# Patient Record
Sex: Female | Born: 1980 | Race: Black or African American | Hispanic: No | Marital: Single | Smoking: Current some day smoker
Health system: Southern US, Community
[De-identification: ages and names within clinical notes are randomized; demographics above are authoritative.]

---

## 2015-04-12 ENCOUNTER — Emergency Department (HOSPITAL_COMMUNITY): Payer: Self-pay

## 2015-04-12 ENCOUNTER — Emergency Department (HOSPITAL_COMMUNITY)
Admission: EM | Admit: 2015-04-12 | Discharge: 2015-04-12 | Disposition: A | Discharge status: Home / Self Care | Payer: Self-pay | Attending: Emergency Medicine | Admitting: Emergency Medicine

## 2015-04-12 ENCOUNTER — Encounter (HOSPITAL_COMMUNITY): Payer: Self-pay | Admitting: Emergency Medicine

## 2015-04-12 DIAGNOSIS — S6992XA Unspecified injury of left wrist, hand and finger(s), initial encounter: Secondary | ICD-10-CM | POA: Insufficient documentation

## 2015-04-12 DIAGNOSIS — W2105XA Struck by basketball, initial encounter: Secondary | ICD-10-CM | POA: Insufficient documentation

## 2015-04-12 DIAGNOSIS — Y9367 Activity, basketball: Secondary | ICD-10-CM | POA: Insufficient documentation

## 2015-04-12 DIAGNOSIS — Z720 Tobacco use: Secondary | ICD-10-CM | POA: Insufficient documentation

## 2015-04-12 DIAGNOSIS — Y998 Other external cause status: Secondary | ICD-10-CM | POA: Insufficient documentation

## 2015-04-12 DIAGNOSIS — Y92310 Basketball court as the place of occurrence of the external cause: Secondary | ICD-10-CM | POA: Insufficient documentation

## 2015-04-12 MED ORDER — IBUPROFEN 400 MG PO TABS
800.0000 mg | ORAL_TABLET | Freq: Once | ORAL | Status: AC
  Administered 2015-04-12: 800 mg via ORAL
  Filled 2015-04-12: qty 2

## 2015-04-12 MED ORDER — NAPROXEN 500 MG PO TABS: 500.0000 mg | ORAL_TABLET | Freq: Two times a day (BID) | ORAL | Status: DC

## 2015-04-12 MED ORDER — TRAMADOL HCL 50 MG PO TABS: 50.0000 mg | ORAL_TABLET | Freq: Four times a day (QID) | ORAL | Status: DC | PRN

## 2015-04-12 NOTE — ED Notes (Signed)
Ortho paged. 

## 2015-04-12 NOTE — Progress Notes (Signed)
Orthopedic Tech Progress Note Patient Details:  Susan Wise Apr 07, 1981 [id number] Applied Velcro thumb spica splint to LUE.  Pulses, sensation, motion intact before and after application.  Capillary refill less than 2 seconds before and after application. Ortho Devices Type of Ortho Device: Thumb velcro splint Ortho Device/Splint Location: LUE Ortho Device/Splint Interventions: Application   Lesle ChrisGilliland, Juandavid Dallman L 04/12/2015, 9:22 PM

## 2015-04-12 NOTE — ED Notes (Signed)
Pt was playing basketball, went to move the ball around her back and slammed her thumb/hand in to her friend's knee.  Pt is having intense pain to the left thumb, some swelling noted.

## 2015-04-12 NOTE — ED Provider Notes (Signed)
CSN: [id number]     Arrival date & time 04/12/15  1904 History  This chart was scribed for a non-physician practitioner, Arthor CaptainAbigail Tierra Thoma, PA-C working with Raeford RazorStephen Kohut, MD by [place] Peace, ED Scribe. The patient was seen in TR02C/TR02C. The patient's care was started at 7:50 PM.     Chief Complaint  Patient presents with  . Hand Pain      Patient is a 34 y.o. female presenting with hand pain. The history is provided by the patient. No language interpreter was used.  Hand Pain This is a new problem. The current episode started 1 to 2 hours ago.  HPI Comments: Susan Wise is a 34 y.o. female who presents to the Emergency Department complaining of hand injury that occurred earlier today while pt was playing basketball and went to dribble the ball behind her back when she smacked her hand left thumb on her friends knee causing her to "jam" her finger. She now complains of severe left thumb pain with swelling that radiates into the base of her hand. Pain exacerbated with movement. Pt is current smoker.    History reviewed. No pertinent past medical history. History reviewed. No pertinent past surgical history. History reviewed. No pertinent family history. History  Substance Use Topics  . Smoking status: Current Some Day Smoker  . Smokeless tobacco: Never Used  . Alcohol Use: Yes     Comment: occasional   OB History    No data available     Review of Systems  Constitutional: Negative for fever and chills.  Gastrointestinal: Negative for nausea, vomiting and diarrhea.  Musculoskeletal: Positive for arthralgias.       Left thumb pain.       Allergies  Review of patient's allergies indicates not on file.  Home Medications   Prior to Admission medications   Not on File   BP 122/75 mmHg  Pulse 74  Temp(Src) 98.8 F (37.1 C) (Oral)  Resp 16  Wt 123 lb 14.4 oz (56.2 kg)  SpO2 100% Physical Exam  Constitutional: She is oriented to person, place, and time. She appears  well-developed and well-nourished. No distress.  HENT:  Head: Normocephalic and atraumatic.  Eyes: Conjunctivae and EOM are normal.  Neck: Neck supple. No tracheal deviation present.  Cardiovascular: Normal rate.   Pulmonary/Chest: Effort normal. No respiratory distress.  Musculoskeletal: Normal range of motion. She exhibits edema and tenderness.  Point tenderness over left 1st MCP. No crepitus or deformity.   Neurological: She is alert and oriented to person, place, and time. No cranial nerve deficit.  Skin: Skin is warm and dry.  Psychiatric: She has a normal mood and affect. Her behavior is normal.  Nursing note and vitals reviewed.   ED Course  Procedures (including critical care time) Labs Review Labs Reviewed - No data to display  Imaging Review No results found.   EKG Interpretation None     Medications - No data to display  7:52 PM- Treatment plan was discussed with patient who verbalizes understanding and agrees.   MDM   Final diagnoses:  Thumb injury, left, initial encounter    Patient X-Ray negative for obvious fracture or dislocation. Pain managed in ED. Pt advised to follow up with orthopedics if symptoms persist for possibility of missed fracture diagnosis. Patient given brace while in ED, conservative therapy recommended and discussed. Patient will be dc home & is agreeable with above plan.    I personally performed the services described in this documentation, which was  scribed in my presence. The recorded information has been reviewed and is accurate.  '    Arthor Captainbigail Arland Usery, PA-C 04/14/15 1639  Raeford RazorStephen Kohut, MD 04/14/15 [phone]

## 2015-04-12 NOTE — Discharge Instructions (Signed)
Gamekeeper's, Skier's Thumb °You have injured some ligaments in your thumb. This can happen suddenly or over a long period of time. It may be difficult for you to hold things by pinching them between your thumb and finger. The injury may take 6 to 8 weeks to heal. Your injury is a strain injury and not a complete tear so it may be treated with a cast. A complete tear of the ligament can lead to a loss of function of the thumb if not fixed surgically. It got its name from when gamekeepers used to kill game (hunted or trapped animals) by pinning them down around the neck between their thumb and index finger.  °HOME CARE INSTRUCTIONS  °· Apply ice to the injury for 15-20 minutes, 03-04 times per day for the first 2 days. Put the ice in a plastic bag and place a towel between the bag of ice and your skin. °· Avoid using your thumb for as long as directed by your caregiver if it is not splinted or in a cast. °· If your hand has been casted or splinted while your thumb heals, follow these instructions: °· Plaster or fiberglass cast: °¨ Do not try to scratch the skin under the cast using a sharp or pointed object. °¨ Check the skin around the cast every day. You may put lotion on any red or sore areas. °¨ Keep your cast dry. Your cast can be protected during bathing with a plastic bag. Do not put your cast into the water. °¨ If your fiberglass cast gets wet, it can be gently dried using a hair dryer. Be careful not burn yourself. °· Plaster splint: °¨ Wear the splint for as long as directed by your caregiver or until you are seen for a follow-up examination. °¨ Do not get your splint wet. Protect it during bathing with a plastic bag. °¨ You may loosen the elastic bandage around the splint if your fingers start to get numb, tingle, get cold, or turn blue. °· Do not put pressure on your cast or splint; this may cause it to break. Do not lean on hard surfaces for 24 hours after application. °· Only take over-the-counter or  prescription medicines for pain, discomfort, or fever as directed by your caregiver. °· IMPORTANT: follow up with your caregiver or keep or call for any appointments with specialists as directed. The failure to follow up could result in chronic pain and / or disability. °SEEK IMMEDIATE MEDICAL CARE IF:  °Your thumb or fingers change color, become painful, or there is numbness or tingling in your thumb or fingers. Your cast or splint may be too tight. °MAKE SURE YOU:  °· Understand these instructions. °· Will watch your condition. °· Will get help right away if you are not doing well or get worse. °Document Released: 11/18/2005 Document Revised: 02/10/2012 Document Reviewed: 07/07/2008 °ExitCare® Patient Information ©2015 [employer]. This information is not intended to replace advice given to you by your health care provider. Make sure you discuss any questions you have with your health care provider. ° °

## 2016-02-11 ENCOUNTER — Emergency Department (HOSPITAL_COMMUNITY)
Admission: EM | Admit: 2016-02-11 | Discharge: 2016-02-11 | Disposition: A | Discharge status: Home / Self Care | Payer: Self-pay | Attending: Emergency Medicine | Admitting: Emergency Medicine

## 2016-02-11 ENCOUNTER — Encounter (HOSPITAL_COMMUNITY): Payer: Self-pay | Admitting: Emergency Medicine

## 2016-02-11 ENCOUNTER — Emergency Department (HOSPITAL_COMMUNITY): Payer: Self-pay

## 2016-02-11 DIAGNOSIS — R69 Illness, unspecified: Secondary | ICD-10-CM

## 2016-02-11 DIAGNOSIS — R112 Nausea with vomiting, unspecified: Secondary | ICD-10-CM | POA: Insufficient documentation

## 2016-02-11 DIAGNOSIS — Z791 Long term (current) use of non-steroidal anti-inflammatories (NSAID): Secondary | ICD-10-CM | POA: Insufficient documentation

## 2016-02-11 DIAGNOSIS — J111 Influenza due to unidentified influenza virus with other respiratory manifestations: Secondary | ICD-10-CM | POA: Insufficient documentation

## 2016-02-11 DIAGNOSIS — F17200 Nicotine dependence, unspecified, uncomplicated: Secondary | ICD-10-CM | POA: Insufficient documentation

## 2016-02-11 MED ORDER — ALBUTEROL SULFATE HFA 108 (90 BASE) MCG/ACT IN AERS: 1.0000 | INHALATION_SPRAY | Freq: Four times a day (QID) | RESPIRATORY_TRACT | Status: DC | PRN

## 2016-02-11 MED ORDER — IPRATROPIUM-ALBUTEROL 0.5-2.5 (3) MG/3ML IN SOLN
3.0000 mL | Freq: Once | RESPIRATORY_TRACT | Status: AC
  Administered 2016-02-11: 3 mL via RESPIRATORY_TRACT
  Filled 2016-02-11: qty 3

## 2016-02-11 MED ORDER — IBUPROFEN 800 MG PO TABS
800.0000 mg | ORAL_TABLET | Freq: Once | ORAL | Status: AC
  Administered 2016-02-11: 800 mg via ORAL
  Filled 2016-02-11: qty 1

## 2016-02-11 MED ORDER — ACETAMINOPHEN 500 MG PO TABS: 1000.0000 mg | ORAL_TABLET | Freq: Four times a day (QID) | ORAL | Status: DC | PRN

## 2016-02-11 MED ORDER — PROMETHAZINE-DM 6.25-15 MG/5ML PO SYRP: 10.0000 mL | ORAL_SOLUTION | Freq: Four times a day (QID) | ORAL | Status: DC | PRN

## 2016-02-11 MED ORDER — IBUPROFEN 800 MG PO TABS: 800.0000 mg | ORAL_TABLET | Freq: Three times a day (TID) | ORAL | Status: DC

## 2016-02-11 MED ORDER — AEROCHAMBER PLUS W/MASK MISC: Status: DC

## 2016-02-11 MED ORDER — ACETAMINOPHEN 500 MG PO TABS
1000.0000 mg | ORAL_TABLET | Freq: Once | ORAL | Status: AC
  Administered 2016-02-11: 1000 mg via ORAL
  Filled 2016-02-11: qty 2

## 2016-02-11 NOTE — ED Provider Notes (Signed)
CSN: [id number]     Arrival date & time 02/11/16  1454 History   First MD Initiated Contact with Patient 02/11/16 1506     Chief Complaint  Patient presents with  . flu like symptoms      (Consider location/radiation/quality/duration/timing/severity/associated sxs/prior Treatment) HPI Subjective fever with chills/sweats for 2 days. Cough with chest congestion. No dyspnea. Generalized myalgia. Nausea with vomiting x 3. No diarrhea. No GU sx. Tried Tylenol/Ibuprofen yesterday w/o relief. Not taken anything today for sx. History reviewed. No pertinent past medical history. History reviewed. No pertinent past surgical history. No family history on file. Social History  Substance Use Topics  . Smoking status: Current Some Day Smoker  . Smokeless tobacco: Never Used  . Alcohol Use: Yes     Comment: occasional   OB History    No data available     Review of Systems  10 Systems reviewed and are negative for acute change except as noted in the HPI.  Allergies  Review of patient's allergies indicates no known allergies.  Home Medications   Prior to Admission medications   Medication Sig Start Date End Date Taking? Authorizing Provider  acetaminophen (TYLENOL) 500 MG tablet Take 2 tablets (1,000 mg total) by mouth every 6 (six) hours as needed. 02/11/16   Arby Barrette, MD  albuterol (PROVENTIL HFA;VENTOLIN HFA) 108 (90 Base) MCG/ACT inhaler Inhale 1-2 puffs into the lungs every 6 (six) hours as needed for wheezing or shortness of breath. 02/11/16   Arby Barrette, MD  ibuprofen (ADVIL,MOTRIN) 800 MG tablet Take 1 tablet (800 mg total) by mouth 3 (three) times daily. 02/11/16   Arby Barrette, MD  naproxen (NAPROSYN) 500 MG tablet Take 1 tablet (500 mg total) by mouth 2 (two) times daily with a meal. 04/12/15   Arthor Captain, PA-C  promethazine-dextromethorphan (PROMETHAZINE-DM) 6.25-15 MG/5ML syrup Take 10 mLs by mouth 4 (four) times daily as needed for cough. 02/11/16   Arby Barrette,  MD  Spacer/Aero-Holding Chambers (AEROCHAMBER PLUS WITH MASK) inhaler Use as instructed 02/11/16   Arby Barrette, MD  traMADol (ULTRAM) 50 MG tablet Take 1 tablet (50 mg total) by mouth every 6 (six) hours as needed. 04/12/15   Abigail Harris, PA-C   BP 136/79 mmHg  Pulse 91  Temp(Src) 99.5 F (37.5 C) (Oral)  Resp 20  SpO2 99%  LMP 02/04/2016 Physical Exam  Constitutional: She is oriented to person, place, and time. She appears well-developed and well-nourished.  HENT:  Head: Normocephalic and atraumatic.  Eyes: EOM are normal. Pupils are equal, round, and reactive to light.  Neck: Neck supple.  Cardiovascular: Normal rate, regular rhythm, normal heart sounds and intact distal pulses.   Pulmonary/Chest: Effort normal and breath sounds normal.  Harsh cough paroxsym with deep inspiration. Wheeze and rhonchi mid rt lung . Cleared with cough. O/w good airflow, no resp distress  Abdominal: Soft. Bowel sounds are normal. She exhibits no distension. There is no tenderness.  Musculoskeletal: Normal range of motion. She exhibits no edema or tenderness.  Neurological: She is alert and oriented to person, place, and time. She has normal strength. Coordination normal. GCS eye subscore is 4. GCS verbal subscore is 5. GCS motor subscore is 6.  Skin: Skin is warm, dry and intact.  Psychiatric: She has a normal mood and affect.    ED Course  Procedures (including critical care time) Labs Review Labs Reviewed - No data to display  Imaging Review Dg Chest 2 View  02/11/2016  CLINICAL DATA:  Two-day  history of cough EXAM: CHEST  2 VIEW COMPARISON:  None. FINDINGS: The heart size and mediastinal contours are within normal limits. Both lungs are clear. The visualized skeletal structures are unremarkable. IMPRESSION: No active cardiopulmonary disease. Electronically Signed   By: Kennith CenterEric  Mansell M.D.   On: 02/11/2016 15:40   I have personally reviewed and evaluated these images and lab results as part of  my medical decision-making.   EKG Interpretation None      MDM   Final diagnoses:  Influenza-like illness   No medical comorbid illness. Nontoxic without respiratory distress.     Arby BarretteMarcy Holdan Stucke, MD 02/11/16 [phone]

## 2016-02-11 NOTE — Discharge Instructions (Signed)
Suspected Influenza, Adult °Influenza ("the flu") is a viral infection of the respiratory tract. It occurs more often in winter months because people spend more time in close contact with one another. Influenza can make you feel very sick. Influenza easily spreads from person to person (contagious). °CAUSES  °Influenza is caused by a virus that infects the respiratory tract. You can catch the virus by breathing in droplets from an infected person's cough or sneeze. You can also catch the virus by touching something that was recently contaminated with the virus and then touching your mouth, nose, or eyes. °RISKS AND COMPLICATIONS °You may be at risk for a more severe case of influenza if you smoke cigarettes, have diabetes, have chronic heart disease (such as heart failure) or lung disease (such as asthma), or if you have a weakened immune system. Elderly people and pregnant women are also at risk for more serious infections. The most common problem of influenza is a lung infection (pneumonia). Sometimes, this problem can require emergency medical care and may be life threatening. °SIGNS AND SYMPTOMS  °Symptoms typically last 4 to 10 days and may include: °· Fever. °· Chills. °· Headache, body aches, and muscle aches. °· Sore throat. °· Chest discomfort and cough. °· Poor appetite. °· Weakness or feeling tired. °· Dizziness. °· Nausea or vomiting. °DIAGNOSIS  °Diagnosis of influenza is often made based on your history and a physical exam. A nose or throat swab test can be done to confirm the diagnosis. °TREATMENT  °In mild cases, influenza goes away on its own. Treatment is directed at relieving symptoms. For more severe cases, your health care provider may prescribe antiviral medicines to shorten the sickness. Antibiotic medicines are not effective because the infection is caused by a virus, not by bacteria. °HOME CARE INSTRUCTIONS °· Take medicines only as directed by your health care provider. °· Use a cool mist  humidifier to make breathing easier. °· Get plenty of rest until your temperature returns to normal. This usually takes 3 to 4 days. °· Drink enough fluid to keep your urine clear or pale yellow. °· Cover your mouth and nose when coughing or sneezing, and wash your hands well to prevent the virus from spreading. °· Stay home from work or school until the fever is gone for at least 1 full day. °PREVENTION  °An annual influenza vaccination (flu shot) is the best way to avoid getting influenza. An annual flu shot is now routinely recommended for all adults in the [place] °SEEK MEDICAL CARE IF: °· You experience chest pain, your cough worsens, or you produce more mucus. °· You have nausea, vomiting, or diarrhea. °· Your fever returns or gets worse. °SEEK IMMEDIATE MEDICAL CARE IF: °· You have trouble breathing, you become short of breath, or your skin or nails become bluish. °· You have severe pain or stiffness in the neck. °· You develop a sudden headache, or pain in the face or ear. °· You have nausea or vomiting that you cannot control. °MAKE SURE YOU:  °· Understand these instructions. °· Will watch your condition. °· Will get help right away if you are not doing well or get worse. °  °This information is not intended to replace advice given to you by your health care provider. Make sure you discuss any questions you have with your health care provider. °  °Document Released: 11/15/2000 Document Revised: 12/09/2014 Document Reviewed: 02/17/2012 °Elsevier Interactive Patient Education ©2016 [employer] ° °

## 2016-02-11 NOTE — ED Notes (Signed)
Per pt, states body aches, congestion, flu like symptoms for 2 days

## 2016-05-17 ENCOUNTER — Emergency Department (HOSPITAL_COMMUNITY)
Admission: EM | Admit: 2016-05-17 | Discharge: 2016-05-17 | Disposition: A | Discharge status: Home / Self Care | Payer: Self-pay | Attending: Emergency Medicine | Admitting: Emergency Medicine

## 2016-05-17 ENCOUNTER — Encounter (HOSPITAL_COMMUNITY): Payer: Self-pay | Admitting: Emergency Medicine

## 2016-05-17 DIAGNOSIS — Z791 Long term (current) use of non-steroidal anti-inflammatories (NSAID): Secondary | ICD-10-CM | POA: Insufficient documentation

## 2016-05-17 DIAGNOSIS — F17200 Nicotine dependence, unspecified, uncomplicated: Secondary | ICD-10-CM | POA: Insufficient documentation

## 2016-05-17 DIAGNOSIS — M7582 Other shoulder lesions, left shoulder: Secondary | ICD-10-CM

## 2016-05-17 DIAGNOSIS — M62830 Muscle spasm of back: Secondary | ICD-10-CM | POA: Insufficient documentation

## 2016-05-17 DIAGNOSIS — Z79899 Other long term (current) drug therapy: Secondary | ICD-10-CM | POA: Insufficient documentation

## 2016-05-17 DIAGNOSIS — M778 Other enthesopathies, not elsewhere classified: Secondary | ICD-10-CM

## 2016-05-17 DIAGNOSIS — M7592 Shoulder lesion, unspecified, left shoulder: Secondary | ICD-10-CM | POA: Insufficient documentation

## 2016-05-17 MED ORDER — DIAZEPAM 5 MG PO TABS
5.0000 mg | ORAL_TABLET | Freq: Once | ORAL | Status: AC
  Administered 2016-05-17: 5 mg via ORAL
  Filled 2016-05-17: qty 1

## 2016-05-17 MED ORDER — KETOROLAC TROMETHAMINE 30 MG/ML IJ SOLN
30.0000 mg | Freq: Once | INTRAMUSCULAR | Status: AC
  Administered 2016-05-17: 30 mg via INTRAMUSCULAR

## 2016-05-17 MED ORDER — CYCLOBENZAPRINE HCL 10 MG PO TABS: 10.0000 mg | ORAL_TABLET | Freq: Two times a day (BID) | ORAL | Status: DC | PRN

## 2016-05-17 MED ORDER — KETOROLAC TROMETHAMINE 30 MG/ML IJ SOLN
30.0000 mg | Freq: Once | INTRAMUSCULAR | Status: DC
  Filled 2016-05-17: qty 1

## 2016-05-17 MED ORDER — MELOXICAM 7.5 MG PO TABS: 15.0000 mg | ORAL_TABLET | Freq: Every day | ORAL | Status: DC

## 2016-05-17 NOTE — ED Notes (Signed)
Pt reports left shoulder pain for 2 weeks unrelieved by motrin. Denies other symptoms.

## 2016-05-17 NOTE — ED Provider Notes (Signed)
CSN: [id number]     Arrival date & time 05/17/16  1732 History  By signing my name below, I, Ronney LionSuzanne Le, attest that this documentation has been prepared under the direction and in the presence of Joycie PeekBenjamin Kaidance Pantoja, PA-C. Electronically Signed: Ronney LionSuzanne Le, ED Scribe. 05/17/2016. 7:11 PM.    Chief Complaint  Patient presents with  . Shoulder Pain   The history is provided by the patient. No language interpreter was used.    HPI Comments: Susan Wise is a 35 y.o. female who presents to the Emergency Department complaining of constant, gradually worsening, aching, left shoulder pain that began 2 weeks ago. 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 She states she also plays basketball, using her left arm predominantly. However, she denies any known trauma or injury. Patient states she has tried Motrin, Tylenol, and analgesic patches, all with no relief. Abducting her shoulder exacerbates her pain. Patient states she is left-hand-dominant. She denies numbness, Swelling, redness, chest pain or problems breathing. Patient states her friend is driving her home today.  History reviewed. No pertinent past medical history. History reviewed. No pertinent past surgical history. No family history on file. Social History  Substance Use Topics  . Smoking status: Current Some Day Smoker  . Smokeless tobacco: Never Used  . Alcohol Use: Yes     Comment: occasional   OB History    No data available     Review of Systems A complete 10 system review of systems was obtained and all systems are negative except as noted in the HPI and PMH.    Allergies  Review of patient's allergies indicates no known allergies.  Home Medications   Prior to Admission medications   Medication Sig Start Date End Date Taking? Authorizing Provider  acetaminophen (TYLENOL) 500 MG tablet Take 2 tablets (1,000 mg total) by mouth every 6 (six) hours as needed. 02/11/16   Arby BarretteMarcy Pfeiffer, MD   albuterol (PROVENTIL HFA;VENTOLIN HFA) 108 (90 Base) MCG/ACT inhaler Inhale 1-2 puffs into the lungs every 6 (six) hours as needed for wheezing or shortness of breath. 02/11/16   Arby BarretteMarcy Pfeiffer, MD  cyclobenzaprine (FLEXERIL) 10 MG tablet Take 1 tablet (10 mg total) by mouth 2 (two) times daily as needed for muscle spasms. 05/17/16   Joycie PeekBenjamin Taliah Porche, PA-C  ibuprofen (ADVIL,MOTRIN) 800 MG tablet Take 1 tablet (800 mg total) by mouth 3 (three) times daily. 02/11/16   Arby BarretteMarcy Pfeiffer, MD  meloxicam (MOBIC) 7.5 MG tablet Take 2 tablets (15 mg total) by mouth daily. 05/17/16   Joycie PeekBenjamin Charliee Krenz, PA-C  naproxen (NAPROSYN) 500 MG tablet Take 1 tablet (500 mg total) by mouth 2 (two) times daily with a meal. 04/12/15   Arthor CaptainAbigail Harris, PA-C  promethazine-dextromethorphan (PROMETHAZINE-DM) 6.25-15 MG/5ML syrup Take 10 mLs by mouth 4 (four) times daily as needed for cough. 02/11/16   Arby BarretteMarcy Pfeiffer, MD  Spacer/Aero-Holding Chambers (AEROCHAMBER PLUS WITH MASK) inhaler Use as instructed 02/11/16   Arby BarretteMarcy Pfeiffer, MD  traMADol (ULTRAM) 50 MG tablet Take 1 tablet (50 mg total) by mouth every 6 (six) hours as needed. 04/12/15   Abigail Harris, PA-C   BP 131/92 mmHg  Pulse 76  Temp(Src) 98.5 F (36.9 C) (Oral)  Resp 15  SpO2 100%  LMP 04/22/2016 Physical Exam  Constitutional: She is oriented to person, place, and time. She appears well-developed and well-nourished. No distress.  HENT:  Head: Normocephalic and atraumatic.  Eyes: Conjunctivae and EOM are normal.  Neck: Neck supple. No  tracheal deviation present.  Cardiovascular: Normal rate.   Pulmonary/Chest: Effort normal. No respiratory distress.  Musculoskeletal: She exhibits tenderness.  Palpable left-sided thoracic spine muscle spasm. Tenderness to posterior aspect of left shoulder with range of motion. No joint swelling, erythema. No unilateral arm swelling. Distal pulses intact.  Neurological: She is alert and oriented to person, place, and time.  Motor  strength and sensation are baseline for patient.  Skin: Skin is warm and dry.  Psychiatric: She has a normal mood and affect. Her behavior is normal.  Nursing note and vitals reviewed.   ED Course  Procedures (including critical care time)  DIAGNOSTIC STUDIES: Oxygen Saturation is 100% on RA, normal by my interpretation.    COORDINATION OF CARE: 7:02 PM - Suspect tendinitis/muscle spasm. Discussed treatment plan with pt at bedside which includes Toradol injection and Valium administered here. Will also discharge with anti-inflammatory medications. Advised pt to continue light use of shoulder to prevent adhesive capsulitis. Discussed ROM exercises. Pt verbalized understanding and agreed to plan.   MDM  Exam consistent with a tendinitis of left shoulder with associated paraspinal muscle spasm. Remains neurovascularly intact with full active range of motion. No erythema, swelling or other concern for DVT. Experiences relief with Toradol and oral diazepam and emergency department. Discharged with symptomatic support at home. Follow-up with PCP. Final diagnoses:  Left shoulder tendinitis  Muscle spasm of back    I personally performed the services described in this documentation, which was scribed in my presence. The recorded information has been reviewed and is accurate.     Joycie Peek, PA-C 05/17/16 1933  Gwyneth Sprout, MD 05/18/16 [phone]

## 2016-05-17 NOTE — Discharge Instructions (Signed)
Please take your medications as prescribed. Your Flexeril may cause you to become drowsy, do not drive or operate machinery on taking this medication. Follow-up with your doctor as needed. Return to ED for new or worsening symptoms.

## 2017-04-07 ENCOUNTER — Emergency Department (HOSPITAL_COMMUNITY): Payer: Self-pay

## 2017-04-07 ENCOUNTER — Encounter (HOSPITAL_COMMUNITY): Payer: Self-pay | Admitting: Emergency Medicine

## 2017-04-07 ENCOUNTER — Emergency Department (HOSPITAL_COMMUNITY)
Admission: EM | Admit: 2017-04-07 | Discharge: 2017-04-07 | Disposition: A | Discharge status: Home / Self Care | Payer: Self-pay | Attending: Emergency Medicine | Admitting: Emergency Medicine

## 2017-04-07 DIAGNOSIS — Y999 Unspecified external cause status: Secondary | ICD-10-CM | POA: Insufficient documentation

## 2017-04-07 DIAGNOSIS — W25XXXA Contact with sharp glass, initial encounter: Secondary | ICD-10-CM | POA: Insufficient documentation

## 2017-04-07 DIAGNOSIS — Y939 Activity, unspecified: Secondary | ICD-10-CM | POA: Insufficient documentation

## 2017-04-07 DIAGNOSIS — S61215A Laceration without foreign body of left ring finger without damage to nail, initial encounter: Secondary | ICD-10-CM | POA: Insufficient documentation

## 2017-04-07 DIAGNOSIS — F17200 Nicotine dependence, unspecified, uncomplicated: Secondary | ICD-10-CM | POA: Insufficient documentation

## 2017-04-07 DIAGNOSIS — Z79899 Other long term (current) drug therapy: Secondary | ICD-10-CM | POA: Insufficient documentation

## 2017-04-07 DIAGNOSIS — S61219A Laceration without foreign body of unspecified finger without damage to nail, initial encounter: Secondary | ICD-10-CM

## 2017-04-07 DIAGNOSIS — S61012A Laceration without foreign body of left thumb without damage to nail, initial encounter: Secondary | ICD-10-CM | POA: Insufficient documentation

## 2017-04-07 DIAGNOSIS — S61217A Laceration without foreign body of left little finger without damage to nail, initial encounter: Secondary | ICD-10-CM | POA: Insufficient documentation

## 2017-04-07 DIAGNOSIS — Y929 Unspecified place or not applicable: Secondary | ICD-10-CM | POA: Insufficient documentation

## 2017-04-07 MED ORDER — TETANUS-DIPHTH-ACELL PERTUSSIS 5-2.5-18.5 LF-MCG/0.5 IM SUSP
0.5000 mL | Freq: Once | INTRAMUSCULAR | Status: AC
  Administered 2017-04-07: 0.5 mL via INTRAMUSCULAR
  Filled 2017-04-07: qty 0.5

## 2017-04-07 MED ORDER — BACITRACIN ZINC 500 UNIT/GM EX OINT
TOPICAL_OINTMENT | Freq: Two times a day (BID) | CUTANEOUS | Status: DC
  Administered 2017-04-07: 1 via TOPICAL
  Filled 2017-04-07: qty 0.9

## 2017-04-07 MED ORDER — CEPHALEXIN 500 MG PO CAPS: 500.0000 mg | ORAL_CAPSULE | Freq: Four times a day (QID) | ORAL | 0 refills | Status: DC

## 2017-04-07 MED ORDER — KETOROLAC TROMETHAMINE 30 MG/ML IJ SOLN
30.0000 mg | Freq: Once | INTRAMUSCULAR | Status: AC
  Administered 2017-04-07: 30 mg via INTRAMUSCULAR
  Filled 2017-04-07: qty 1

## 2017-04-07 NOTE — Discharge Instructions (Signed)
Makes you keep the wound clean and dry. Change dressing every 24 hours with antibiotic ointment. Wear splint to help with healing. Take antibiotics as prescribed. Return to the Ed if you develop signs of infection.

## 2017-04-07 NOTE — ED Notes (Signed)
PT DISCHARGED. INSTRUCTIONS AND PRESCRIPTION GIVEN. AAOX4. PT IN NO APPARENT DISTRESS OR PAIN. THE OPPORTUNITY TO ASK QUESTIONS WAS PROVIDED. 

## 2017-04-07 NOTE — ED Provider Notes (Signed)
[facility] Provider Note   CSN: [id number] Arrival date & time: 04/07/17  1702  By signing my name below, I, Susan Wise, attest that this documentation has been prepared under the direction and in the presence of Susan Mu, PA-C.  Electronically Signed: Phillips Wise, Scribe. 04/07/2017. 8:39 PM.   History   Chief Complaint Chief Complaint  Patient presents with  . Laceration  . Hand Pain    Susan Wise is a 36 y.o. female with no pertinent PMHx who presents to the Emergency Department with complaints of left ring and pinky finger pain s/p a finger laceration x18 hours ago. Pt hit her hand on a perfume bottle last night and notes removing glass shards from the wound. She is able to bend her finger and reports intact sensation to fingertips.   Pt denies experiencing any other acute sx, including numbness, weakness, fever, nausea or vomiting. Her tetanus shot is not UTD.   The history is provided by the patient and medical records. No language interpreter was used.   History reviewed. No pertinent past medical history.  There are no active problems to display for this patient.  History reviewed. No pertinent surgical history.  OB History    No data available     Home Medications    Prior to Admission medications   Medication Sig Start Date End Date Taking? Authorizing Provider  acetaminophen (TYLENOL) 500 MG tablet Take 2 tablets (1,000 mg total) by mouth every 6 (six) hours as needed. 02/11/16   Arby Barrette, MD  albuterol (PROVENTIL HFA;VENTOLIN HFA) 108 (90 Base) MCG/ACT inhaler Inhale 1-2 puffs into the lungs every 6 (six) hours as needed for wheezing or shortness of breath. 02/11/16   Arby Barrette, MD  cyclobenzaprine (FLEXERIL) 10 MG tablet Take 1 tablet (10 mg total) by mouth 2 (two) times daily as needed for muscle spasms. 05/17/16   Wise, Susan Salina, PA-C  ibuprofen (ADVIL,MOTRIN) 800 MG tablet Take 1 tablet (800 mg total) by mouth 3  (three) times daily. 02/11/16   Arby Barrette, MD  meloxicam (MOBIC) 7.5 MG tablet Take 2 tablets (15 mg total) by mouth daily. 05/17/16   Wise, Susan Salina, PA-C  naproxen (NAPROSYN) 500 MG tablet Take 1 tablet (500 mg total) by mouth 2 (two) times daily with a meal. 04/12/15   Wise, Abigail, PA-C  promethazine-dextromethorphan (PROMETHAZINE-DM) 6.25-15 MG/5ML syrup Take 10 mLs by mouth 4 (four) times daily as needed for cough. 02/11/16   Arby Barrette, MD  Spacer/Aero-Holding Chambers (AEROCHAMBER PLUS WITH MASK) inhaler Use as instructed 02/11/16   Arby Barrette, MD  traMADol (ULTRAM) 50 MG tablet Take 1 tablet (50 mg total) by mouth every 6 (six) hours as needed. 04/12/15   Arthor Captain, PA-C   Family History No family history on file.  Social History Social History  Substance Use Topics  . Smoking status: Current Some Day Smoker  . Smokeless tobacco: Never Used  . Alcohol use Yes     Comment: occasional    Allergies   Patient has no known allergies.  Review of Systems Review of Systems  Constitutional: Negative for fever.  Gastrointestinal: Negative for nausea and vomiting.  Musculoskeletal:       Finger pain  Skin: Positive for wound.   Physical Exam Updated Vital Signs BP 120/63 (BP Location: Right Arm)   Pulse 74   Temp 98.2 F (36.8 C) (Oral)   Resp 17   Ht 5\' 8"  (1.727 m)   Wt 130 lb (59  kg)   LMP 04/04/2017   SpO2 100%   BMI 19.77 kg/m   Physical Exam  Constitutional: She is oriented to person, place, and time. She appears well-developed and well-nourished. No distress.  HENT:  Head: Normocephalic and atraumatic.  Cardiovascular: Normal rate.   Pulmonary/Chest: Effort normal.  Musculoskeletal:  Left hand with normal cap refill. Sensation intact to sharp/ dull. FROM of hand and fingers. No pain with passive movement of joints. Radial pulses 2+. No signs of infection.  2mm laceration to the left pinky finger on the palmar side over the PIP, that is  very superficial. No bleeding. Full PIP and DIP.   1cm superficial laceration to the palmar side of the ring finger over the PIP. No bleeding. Full range of motion of the PIP and DIP.   1cm extremely superficial cut to the palmar side of the thumb. Full range of motion of DIP and PIP.  Neurological: She is alert and oriented to person, place, and time.  Skin: Skin is warm and dry.  Psychiatric: She has a normal mood and affect.  Nursing note and vitals reviewed.      ED Treatments / Results  DIAGNOSTIC STUDIES: Oxygen Saturation is 100% on RA, nl by my interpretation.    COORDINATION OF CARE: 8:09 PM Discussed treatment plan with pt at bedside and pt agreed to plan. Discussed non-significant radiology results and plan to pressure bandage finger lac. Pt agrees and would not like stitches at this time. 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   Labs (all labs ordered are listed, but only abnormal results are displayed) Labs Reviewed - No data to display  EKG  EKG Interpretation None      Radiology Dg Hand Complete Left  Result Date: 04/07/2017 CLINICAL DATA:  Cut left hand on glass, with laceration at the distal left thumb, fourth middle phalanx and fifth proximal interphalangeal joint. Initial encounter. EXAM: LEFT HAND - COMPLETE 3+ VIEW COMPARISON:  None. FINDINGS: There is no evidence of fracture or dislocation. The joint spaces are preserved. The carpal rows are intact, and demonstrate normal alignment. Known soft tissue lacerations are not well characterized. No radiopaque foreign bodies are seen. IMPRESSION: No evidence of fracture or dislocation. No radiopaque foreign bodies seen. Electronically Signed   By: Roanna Raider M.D.   On: 04/07/2017 19:11    Procedures Procedures (including critical care time)  Medications Ordered in ED Medications  Tdap (BOOSTRIX) injection 0.5 mL (0.5 mLs Intramuscular Given 04/07/17 2042)  ketorolac (TORADOL) 30 MG/ML injection 30 mg (30  mg Intramuscular Given 04/07/17 2043)     Initial Impression / Assessment and Plan / ED Course  I have reviewed the triage vital signs and the nursing notes.  Pertinent labs & imaging results that were available during my care of the patient were reviewed by me and considered in my medical decision making (see chart for details).     Patient presents to the emergency Department complaining of lacerations to the left thumb, ring finger, pinky finger following an incident 18 hours ago. Lacerations are very superficial. Bleeding is controlled. Patient is neurovascularly intact. Imaging was obtained that showed no foreign bodies or bony involvement. Patient's tetanus was updated. Given that the injury occurred 18 hours ago and is very superficial do not feel that sutures are warranted. Wounds were irrigated with saline. Antibiotic ointment was applied and dressed. Patient placed in finger splints. Place patient on prophylactic antibiotics. Given patient's strict return precautions. Patient  verbalized understanding of care and all questions were answered prior to discharge. Patient encouraged follow up PCP or return to the ED.  SPLINT APPLICATION Date/Time: 1:08 AM Authorized by: Demetrios LollKenneth Jerlyn Pain Consent: Verbal consent obtained. Risks and benefits: risks, benefits and alternatives were discussed Consent given by: patient Splint applied by: orthopedic technician Location details: Left ring and pinky finger Splint type: static finger splint Supplies used: static finger splint Post-procedure: The splinted body part was neurovascularly unchanged following the procedure. Patient tolerance: Patient tolerated the procedure well with no immediate complications.     Final Clinical Impressions(s) / ED Diagnoses   Final diagnoses:  Laceration of finger of left hand without foreign body without damage to nail, unspecified finger, initial encounter    New Prescriptions Discharge Medication List as  of 04/07/2017  8:43 PM    START taking these medications   Details  cephALEXin (KEFLEX) 500 MG capsule Take 1 capsule (500 mg total) by mouth 4 (four) times daily., Starting Mon 04/07/2017, Print       I personally performed the services described in this documentation, which was scribed in my presence. The recorded information has been reviewed and is accurate.    Susan MuLeaphart, Jaxston Chohan T, PA-C 04/08/17 0108    Nira Connardama, Pedro Eduardo, MD 04/08/17 [phone]

## 2017-04-07 NOTE — ED Triage Notes (Signed)
Pt complaint of left ring and pinky finger pain related to laceration as result of perfume bottle at 0200 this am.

## 2018-08-21 IMAGING — CR DG HAND COMPLETE 3+V*L*
3 series · 3 of 3 positions shown · non-contrast
Comparison: None.

CLINICAL DATA: Cut left hand on glass, with laceration at the
distal left thumb, fourth middle phalanx and fifth proximal
interphalangeal joint. Initial encounter.

EXAM:
LEFT HAND - COMPLETE 3+ VIEW

[x hand pa left]
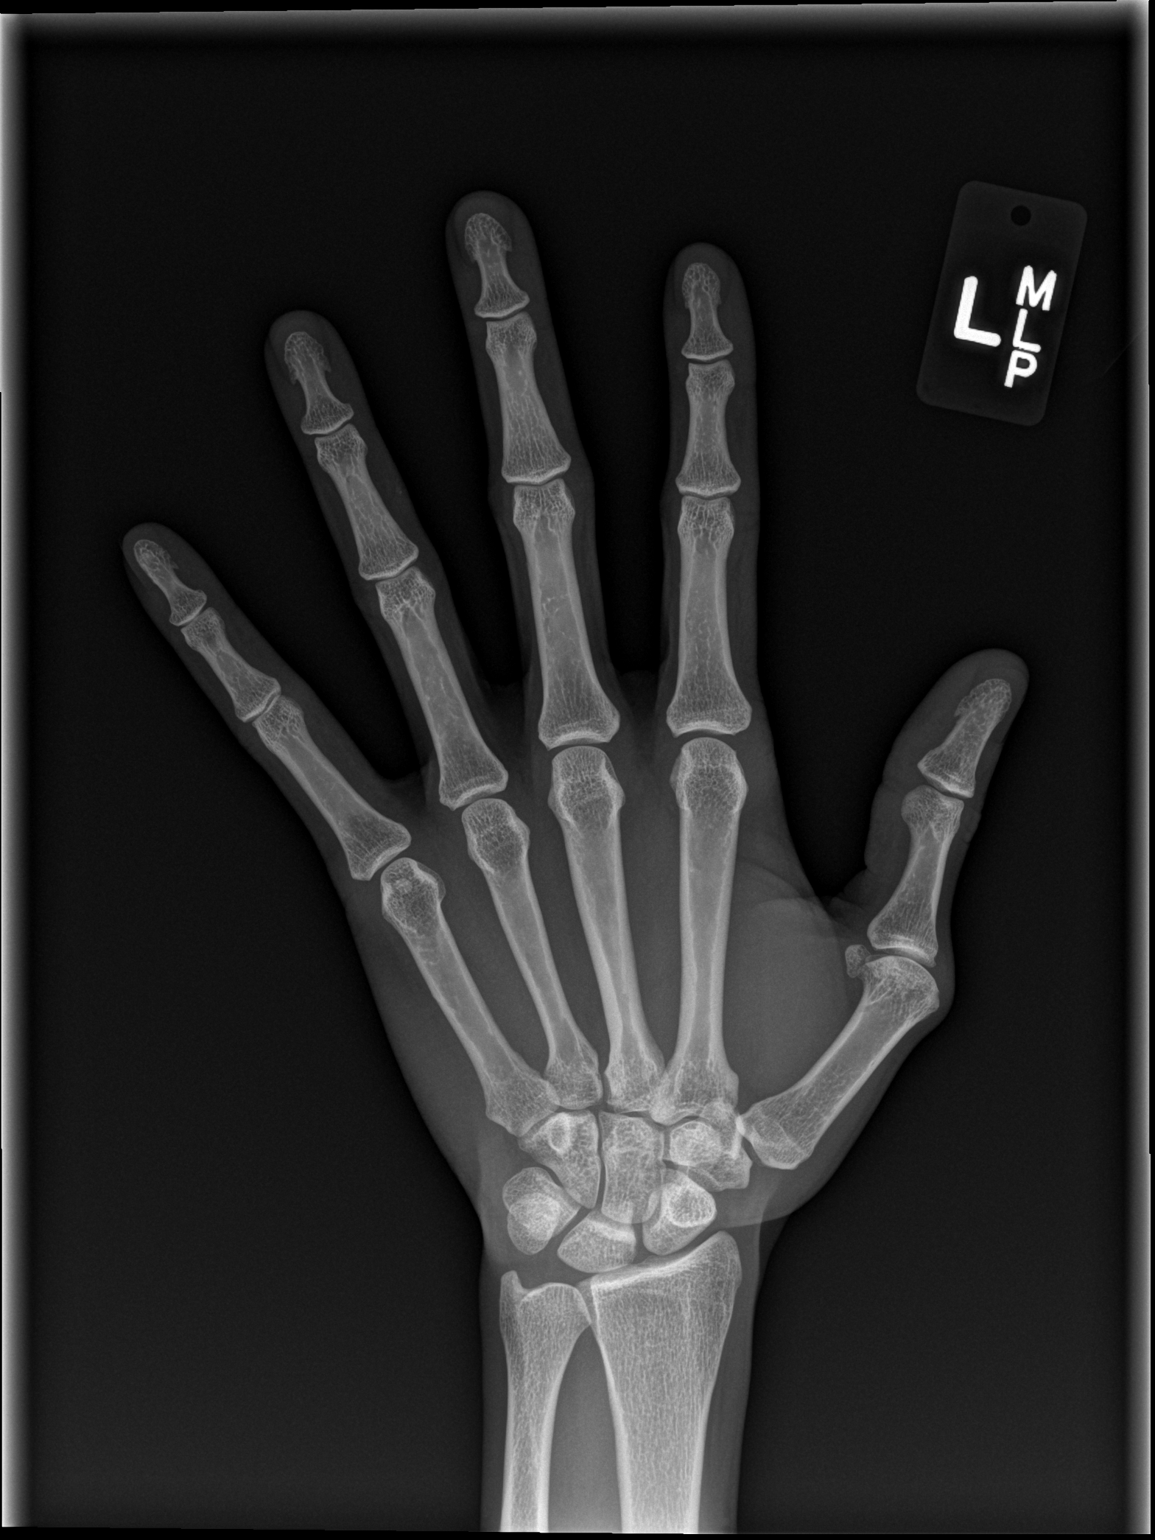

[x hand obl left]
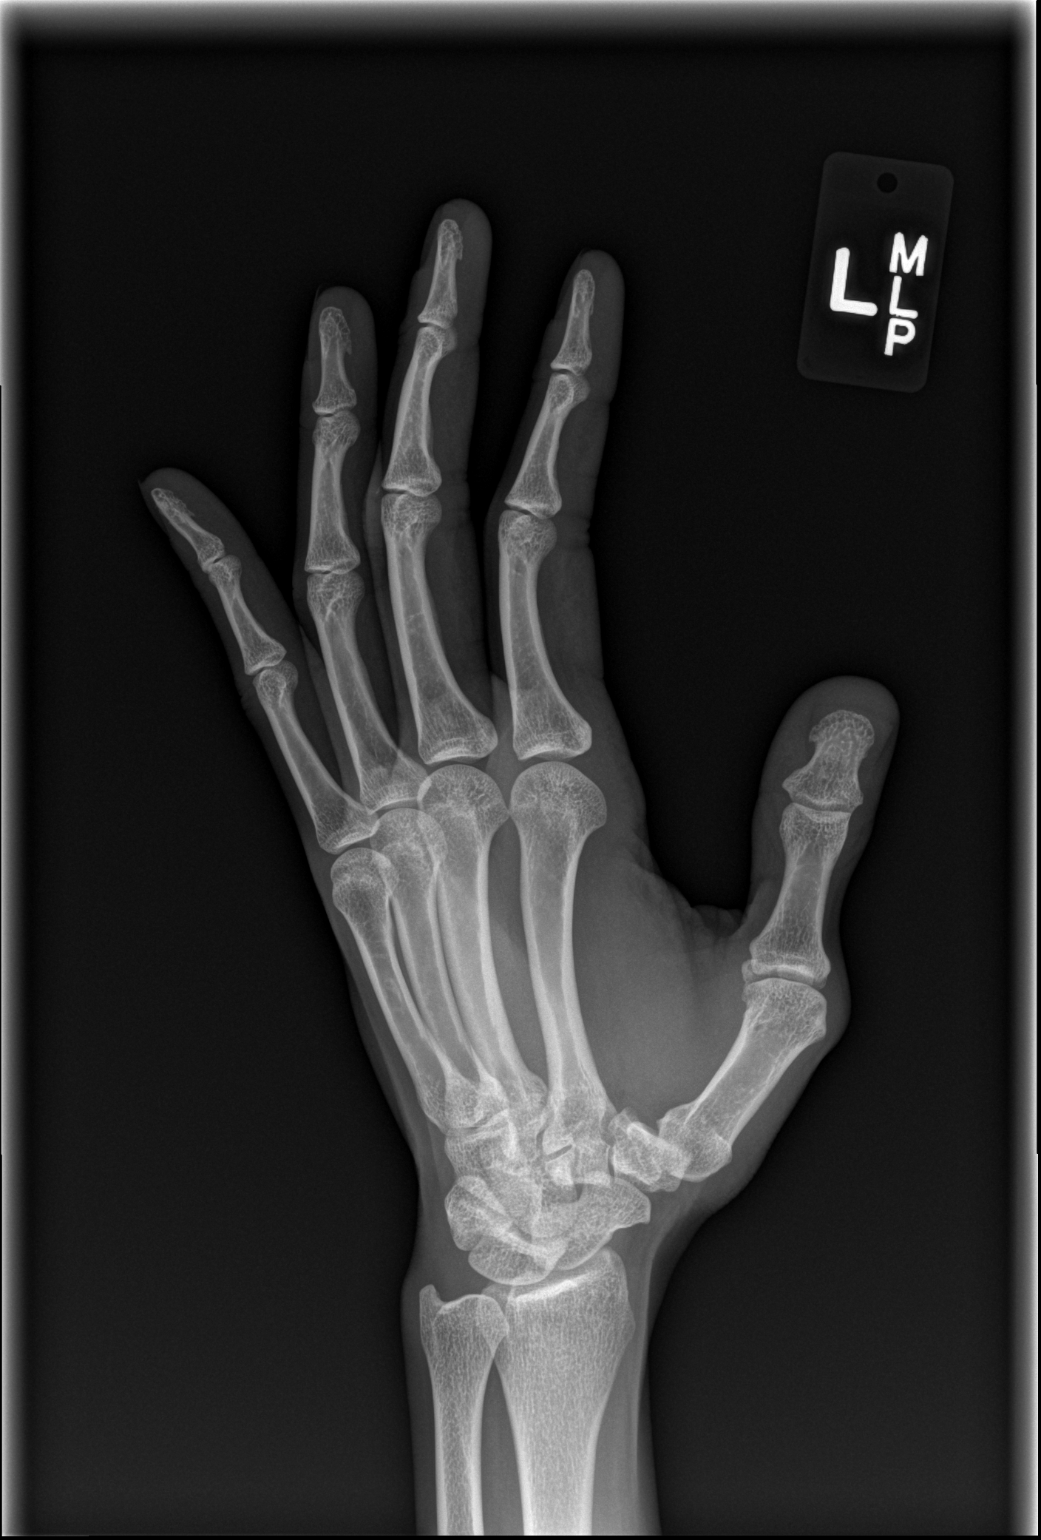

[x hand lat left]
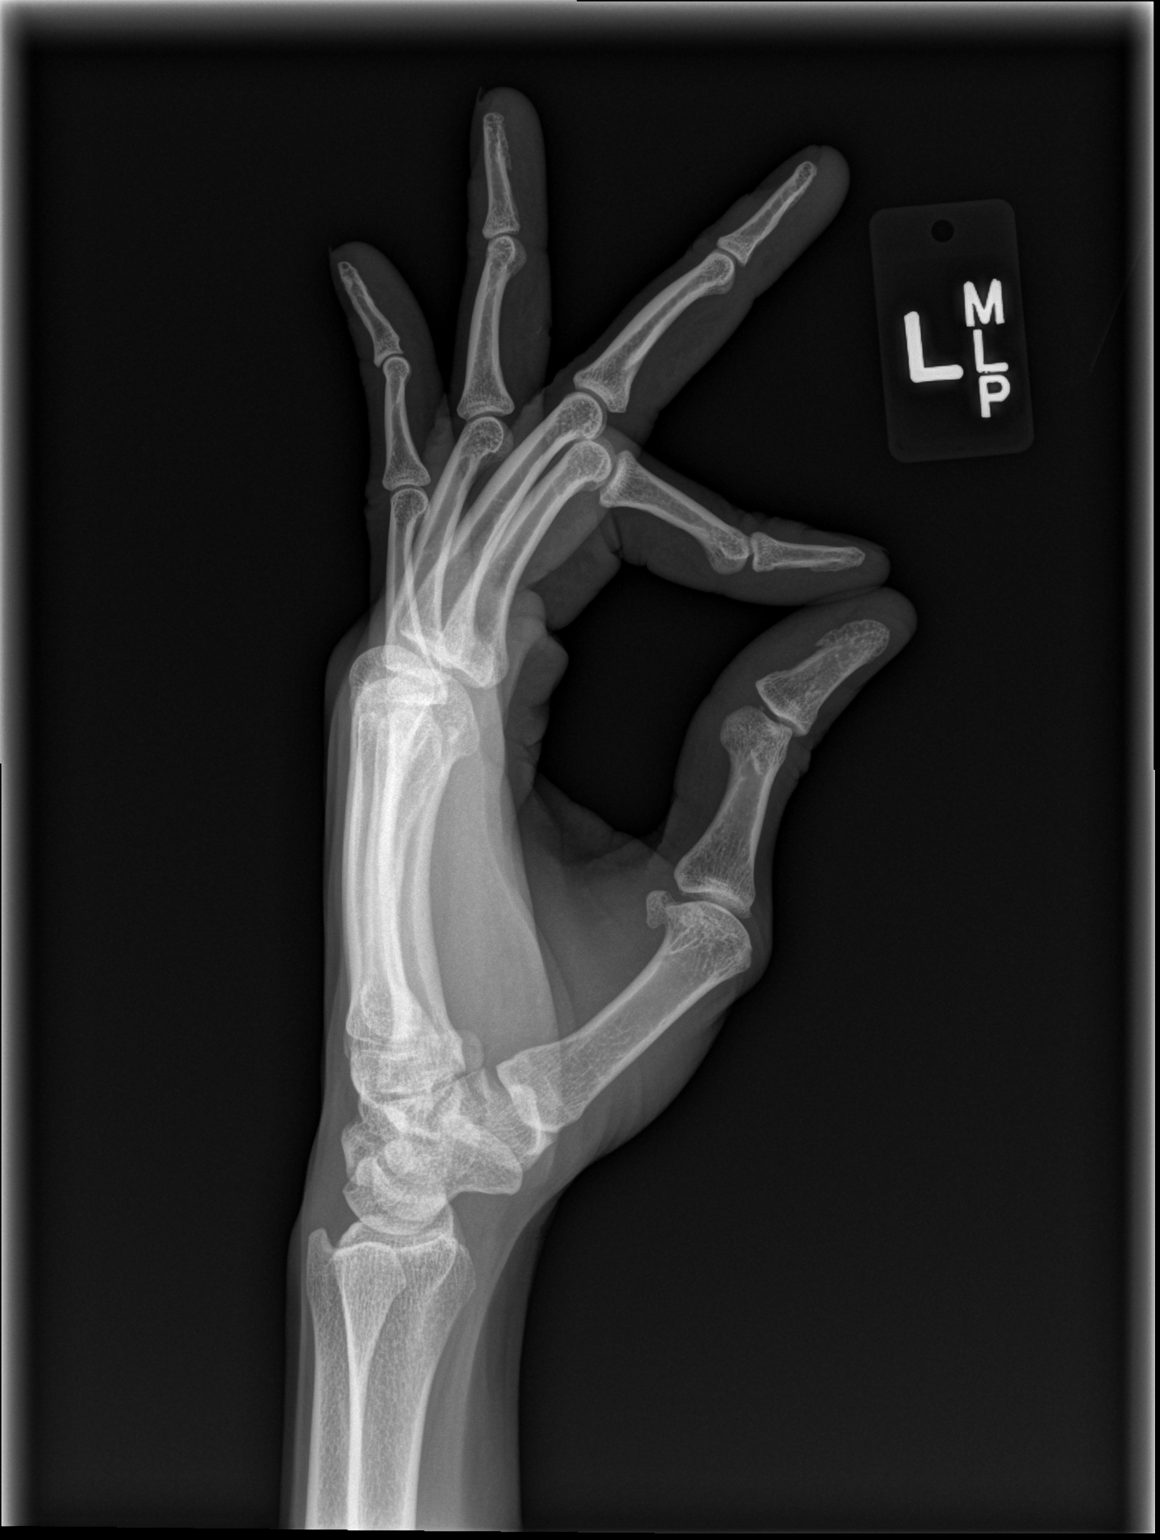

[3 of 3 positions shown; findings below may reference images not displayed]

FINDINGS: There is no evidence of fracture or dislocation. The joint spaces
are preserved. The carpal rows are intact, and demonstrate normal
alignment.

Known soft tissue lacerations are not well characterized. No
radiopaque foreign bodies are seen.
IMPRESSION: No evidence of fracture or dislocation. No radiopaque foreign bodies
seen.

## 2018-09-29 ENCOUNTER — Encounter (HOSPITAL_COMMUNITY): Payer: Self-pay | Admitting: Emergency Medicine

## 2018-09-29 ENCOUNTER — Ambulatory Visit (HOSPITAL_COMMUNITY)
Admission: EM | Admit: 2018-09-29 | Discharge: 2018-09-29 | Disposition: A | Discharge status: Home / Self Care | Payer: Managed Care, Other (non HMO) | Attending: Family Medicine | Admitting: Family Medicine

## 2018-09-29 DIAGNOSIS — S46912A Strain of unspecified muscle, fascia and tendon at shoulder and upper arm level, left arm, initial encounter: Secondary | ICD-10-CM | POA: Diagnosis not present

## 2018-09-29 DIAGNOSIS — M67912 Unspecified disorder of synovium and tendon, left shoulder: Secondary | ICD-10-CM

## 2018-09-29 DIAGNOSIS — M65812 Other synovitis and tenosynovitis, left shoulder: Secondary | ICD-10-CM | POA: Diagnosis not present

## 2018-09-29 MED ORDER — KETOROLAC TROMETHAMINE 30 MG/ML IJ SOLN
INTRAMUSCULAR | Status: AC
  Filled 2018-09-29: qty 1

## 2018-09-29 MED ORDER — DICLOFENAC SODIUM 75 MG PO TBEC: 75.0000 mg | DELAYED_RELEASE_TABLET | Freq: Two times a day (BID) | ORAL | 0 refills | Status: DC

## 2018-09-29 MED ORDER — KETOROLAC TROMETHAMINE 30 MG/ML IJ SOLN
30.0000 mg | Freq: Once | INTRAMUSCULAR | Status: AC
  Administered 2018-09-29: 30 mg via INTRAMUSCULAR

## 2018-09-29 NOTE — Discharge Instructions (Signed)
Use anti-inflammatories for pain/swelling. You may take up to 800 mg Ibuprofen every 8 hours with food. You may supplement Ibuprofen with Tylenol [phone] mg every 8 hours. OR Diclofenac twice daily  Ice Shoulder  Work on Shoulder exercises 2-3 times a day after pain beginning to ease off over the next 2-3 weeks  Avoid heavy lifting for the next 1-2 weeks  Follow up with orthopedics for further evaluation of your shoulder

## 2018-09-29 NOTE — ED Provider Notes (Signed)
[facility]    CSN: [id number] Arrival date & time: 09/29/18  1324     History   Chief Complaint Chief Complaint  Patient presents with  . Shoulder Pain    HPI Susan Wise is a 37 y.o. female resident past medical history presents today for evaluation of left shoulder pain.  Patient states that over the past 2 weeks she has had worsening shoulder pain.  She states that previously in the past she has had rotator cuff tendinitis.  This feels very similar.  Patient states that she is left-handed.  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  She has had occasional numbness tingling going into her hand.  Denies chest pain or shortness of breath.  Increased pain and difficulty with overhead motions.  Denies any specific injury.  Tried ibuprofen.  HPI  History reviewed. No pertinent past medical history.  There are no active problems to display for this patient.   History reviewed. No pertinent surgical history.  OB History   None      Home Medications    Prior to Admission medications   Medication Sig Start Date End Date Taking? Authorizing Provider  acetaminophen (TYLENOL) 500 MG tablet Take 2 tablets (1,000 mg total) by mouth every 6 (six) hours as needed. 02/11/16   Arby Barrette, MD  diclofenac (VOLTAREN) 75 MG EC tablet Take 1 tablet (75 mg total) by mouth 2 (two) times daily. With food 09/29/18   Wieters, Hallie C, PA-C  ibuprofen (ADVIL,MOTRIN) 800 MG tablet Take 1 tablet (800 mg total) by mouth 3 (three) times daily. 02/11/16   Arby Barrette, MD  naproxen (NAPROSYN) 500 MG tablet Take 1 tablet (500 mg total) by mouth 2 (two) times daily with a meal. 04/12/15   Harris, Abigail, PA-C  traMADol (ULTRAM) 50 MG tablet Take 1 tablet (50 mg total) by mouth every 6 (six) hours as needed. 04/12/15   Arthor Captain, PA-C    Family History No family history on file.  Social History Social History   Tobacco Use  . Smoking status:  Current Some Day Smoker  . Smokeless tobacco: Never Used  Substance Use Topics  . Alcohol use: Yes    Comment: occasional  . Drug use: No     Allergies   Patient has no known allergies.   Review of Systems Review of Systems  Constitutional: Negative for fatigue and fever.  Eyes: Negative for visual disturbance.  Respiratory: Negative for shortness of breath.   Cardiovascular: Negative for chest pain.  Gastrointestinal: Negative for abdominal pain, nausea and vomiting.  Musculoskeletal: Positive for arthralgias and myalgias. Negative for joint swelling.  Skin: Negative for color change, rash and wound.  Neurological: Negative for dizziness, weakness, light-headedness and headaches.     Physical Exam Triage Vital Signs ED Triage Vitals  Enc Vitals Group     BP 09/29/18 0829 122/61     Pulse Rate 09/29/18 0829 66     Resp 09/29/18 0829 18     Temp 09/29/18 0829 98.1 F (36.7 C)     Temp Source 09/29/18 0829 Oral     SpO2 09/29/18 0829 100 %     Weight --      Height --      Head Circumference --      Peak Flow --      Pain Score 09/29/18 0830 10     Pain Loc --      Pain Edu? --  Excl. in GC? --    No data found.  Updated Vital Signs BP 122/61 (BP Location: Right Arm)   Pulse 66   Temp 98.1 F (36.7 C) (Oral)   Resp 18   LMP 09/27/2018   SpO2 100%   Visual Acuity Right Eye Distance:   Left Eye Distance:   Bilateral Distance:    Right Eye Near:   Left Eye Near:    Bilateral Near:     Physical Exam  Constitutional: She is oriented to person, place, and time. She appears well-developed and well-nourished.  No acute distress  HENT:  Head: Normocephalic and atraumatic.  Nose: Nose normal.  Eyes: Conjunctivae are normal.  Neck: Neck supple.  Cardiovascular: Normal rate.  Pulmonary/Chest: Effort normal. No respiratory distress.  Breathing comfortably at rest, CTABL, no wheezing, rales or other adventitious sounds auscultated   Abdominal: She  exhibits no distension.  Musculoskeletal: Normal range of motion.  No obvious deformity and swelling to shoulder Nontender along clavicle or scapular spine Tenderness to palpation over scapular musculature, specific pinpoint tenderness more medial posteriorly Full passive range of motion, active range of motion limited with forward flexion and extension Strength intact, 5/5 and equal bilaterally at shoulder, pain elicited, but no weakness appreciate with resisted external rotation, liftoff, Hawkins, Yergason, empty can.  Neurological: She is alert and oriented to person, place, and time.  Skin: Skin is warm and dry.  Psychiatric: She has a normal mood and affect.  Nursing note and vitals reviewed.    UC Treatments / Results  Labs (all labs ordered are listed, but only abnormal results are displayed) Labs Reviewed - No data to display  EKG None  Radiology No results found.  Procedures Procedures (including critical care time)  Medications Ordered in UC Medications  ketorolac (TORADOL) 30 MG/ML injection 30 mg (30 mg Intramuscular Given 09/29/18 0904)    Initial Impression / Assessment and Plan / UC Course  I have reviewed the triage vital signs and the nursing notes.  Pertinent labs & imaging results that were available during my care of the patient were reviewed by me and considered in my medical decision making (see chart for details).     Patient with muscular strain versus rotator cuff tendinopathy versus impingement.  Will treat with anti-inflammatories today.  Toradol provided.  Continue with diclofenac.  Advised shoulder exercises.  Avoid heavy lifting, rest as able.Discussed strict return precautions. Patient verbalized understanding and is agreeable with plan.  Final Clinical Impressions(s) / UC Diagnoses   Final diagnoses:  Strain of left shoulder, initial encounter  Tendinopathy of left rotator cuff     Discharge Instructions     Use anti-inflammatories  for pain/swelling. You may take up to 800 mg Ibuprofen every 8 hours with food. You may supplement Ibuprofen with Tylenol [phone] mg every 8 hours. OR Diclofenac twice daily  Ice Shoulder  Work on Shoulder exercises 2-3 times a day after pain beginning to ease off over the next 2-3 weeks  Avoid heavy lifting for the next 1-2 weeks  Follow up with orthopedics for further evaluation of your shoulder   ED Prescriptions    Medication Sig Dispense Auth. Provider   diclofenac (VOLTAREN) 75 MG EC tablet Take 1 tablet (75 mg total) by mouth 2 (two) times daily. With food 30 tablet Wieters, Novinger C, PA-C     Controlled Substance Prescriptions [place] Controlled Substance Registry consulted? Not Applicable   Lew Dawes, [place] 09/29/18 1112

## 2018-09-29 NOTE — ED Triage Notes (Signed)
PT reports she's been told she has tendonitis in her left rotator cuff. Her left shoulder has been bothering her for 2 weeks. PT has shooting tingling sensation down left arm.

## 2020-04-24 ENCOUNTER — Ambulatory Visit (INDEPENDENT_AMBULATORY_CARE_PROVIDER_SITE_OTHER): Payer: Managed Care, Other (non HMO)

## 2020-04-24 ENCOUNTER — Other Ambulatory Visit: Payer: Self-pay

## 2020-04-24 ENCOUNTER — Ambulatory Visit
Admission: EM | Admit: 2020-04-24 | Discharge: 2020-04-24 | Disposition: A | Discharge status: Home / Self Care | Payer: Managed Care, Other (non HMO) | Attending: Physician Assistant | Admitting: Physician Assistant

## 2020-04-24 ENCOUNTER — Ambulatory Visit (HOSPITAL_COMMUNITY)
Admission: EM | Admit: 2020-04-24 | Discharge: 2020-04-24 | Disposition: A | Discharge status: Home / Self Care | Payer: Managed Care, Other (non HMO) | Attending: Physician Assistant | Admitting: Physician Assistant

## 2020-04-24 DIAGNOSIS — M79642 Pain in left hand: Secondary | ICD-10-CM | POA: Diagnosis not present

## 2020-04-24 DIAGNOSIS — S62337A Displaced fracture of neck of fifth metacarpal bone, left hand, initial encounter for closed fracture: Secondary | ICD-10-CM

## 2020-04-24 DIAGNOSIS — Y9367 Activity, basketball: Secondary | ICD-10-CM | POA: Diagnosis not present

## 2020-04-24 MED ORDER — DICLOFENAC SODIUM 50 MG PO TBEC: 50.0000 mg | DELAYED_RELEASE_TABLET | Freq: Two times a day (BID) | ORAL | 0 refills | Status: AC

## 2020-04-24 MED ORDER — TRAMADOL HCL 50 MG PO TABS: 50.0000 mg | ORAL_TABLET | Freq: Four times a day (QID) | ORAL | 0 refills | Status: AC | PRN

## 2020-04-24 NOTE — Discharge Instructions (Signed)
Dicofenac as directed. Tylenol 1000mg  three times a day. Tramadol if still having pain. Follow up with hand orthopedics for further evaluation and management needed.

## 2020-04-24 NOTE — Progress Notes (Signed)
Orthopedic Tech Progress Note Patient Details:  Susan Wise Apr 17, 1981 [id number]  Ortho Devices Type of Ortho Device: Ulna gutter splint Ortho Device/Splint Location: Left Hand Ortho Device/Splint Interventions: Application   Post Interventions Patient Tolerated: Well Instructions Provided: Care of device   [name] 04/24/2020, 8:21 PM

## 2020-04-24 NOTE — ED Notes (Signed)
Splint applied by ortho tech

## 2020-04-24 NOTE — ED Triage Notes (Signed)
Pt states playing basketball and hit her lt hand on the metal pole. Swelling noted to top of lt hand, ice pack given.

## 2020-04-24 NOTE — ED Notes (Signed)
Called ortho tech, Lux, to request splint placement.

## 2020-04-25 NOTE — ED Provider Notes (Signed)
[facility]    CSN: [id number] Arrival date & time: 04/24/20  1817      History   Chief Complaint Chief Complaint  Patient presents with  . Hand Injury    HPI Susan Wise is a 39 y.o. female.   39 year old female comes in for left hand pain after injury today.  Was playing basketball, fell forward and hit left hand on the metal pole.  Has swelling and pain to ulnar hand and came in for evaluation.  Denies numbness, tingling.  Painful range of motion.  Patient is left-hand dominant.     History reviewed. No pertinent past medical history.  There are no problems to display for this patient.   History reviewed. No pertinent surgical history.  OB History   No obstetric history on file.      Home Medications    Prior to Admission medications   Medication Sig Start Date End Date Taking? Authorizing Provider  diclofenac (VOLTAREN) 50 MG EC tablet Take 1 tablet (50 mg total) by mouth 2 (two) times daily. 04/24/20   Cathie Hoops, Terris Bodin V, PA-C  traMADol (ULTRAM) 50 MG tablet Take 1 tablet (50 mg total) by mouth every 6 (six) hours as needed. 04/24/20   Belinda Fisher, PA-C    Family History History reviewed. No pertinent family history.  Social History Social History   Tobacco Use  . Smoking status: Current Some Day Smoker  . Smokeless tobacco: Never Used  Substance Use Topics  . Alcohol use: Not Currently    Comment: occasional  . Drug use: No     Allergies   Patient has no known allergies.   Review of Systems Review of Systems  Reason unable to perform ROS: See HPI as above.     Physical Exam Triage Vital Signs ED Triage Vitals  Enc Vitals Group     BP 04/24/20 1851 125/80     Pulse Rate 04/24/20 1851 86     Resp 04/24/20 1851 (!) 22     Temp 04/24/20 1851 98.8 F (37.1 C)     Temp Source 04/24/20 1851 Oral     SpO2 04/24/20 1851 99 %     Weight --      Height --      Head Circumference --      Peak Flow --      Pain Score 04/24/20 1903 10       Pain Loc --      Pain Edu? --      Excl. in GC? --    No data found.  Updated Vital Signs BP 125/80 (BP Location: Right Arm)   Pulse 86   Temp 98.8 F (37.1 C) (Oral)   Resp (!) 22   LMP 04/13/2020   SpO2 99%   Physical Exam Constitutional:      General: She is not in acute distress.    Appearance: Normal appearance. She is well-developed. She is not toxic-appearing or diaphoretic.  HENT:     Head: Normocephalic and atraumatic.  Eyes:     Conjunctiva/sclera: Conjunctivae normal.     Pupils: Pupils are equal, round, and reactive to light.  Pulmonary:     Effort: Pulmonary effort is normal. No respiratory distress.     Comments: Speaking in full sentences without difficulty Musculoskeletal:     Cervical back: Normal range of motion and neck supple.     Comments: Swelling to the fifth distal MCP.  No erythema, contusion.  Tenderness to palpation  of fourth and fifth distal MCP.  Decreased flexion of fifth MCP joint due to pain.  Sensation intact and equal bilaterally.  Radial pulse 2+, cap refill less than 2 seconds.  Skin:    General: Skin is warm and dry.  Neurological:     Mental Status: She is alert and oriented to person, place, and time.      UC Treatments / Results  Labs (all labs ordered are listed, but only abnormal results are displayed) Labs Reviewed - No data to display  EKG   Radiology DG Hand Complete Left  Result Date: 04/24/2020 CLINICAL DATA:  Left hand pain after injury playing basketball tonight. EXAM: LEFT HAND - COMPLETE 3+ VIEW COMPARISON:  Radiograph 04/07/2017 FINDINGS: Oblique displaced fracture of the distal fifth metacarpal shaft. No significant angulation. No intra-articular extension. No additional fracture of the hand. Soft tissue edema is noted adjacent to the fracture site. IMPRESSION: Oblique displaced distal fifth metacarpal shaft fracture. Electronically Signed   By: Keith Rake M.D.   On: 04/24/2020 19:21     Procedures Procedures (including critical care time)  Medications Ordered in UC Medications - No data to display  Initial Impression / Assessment and Plan / UC Course  I have reviewed the triage vital signs and the nursing notes.  Pertinent labs & imaging results that were available during my care of the patient were reviewed by me and considered in my medical decision making (see chart for details).    X-ray reviewed by me with displaced fracture of fifth distal MCP.  Unfortunately, no splinting material in office.  Patient will go to [facility] urgent care for splinting.  Charge nurse contacted.  Symptomatic management provided.  Patient to follow-up with hand orthopedics for further evaluation and management needed.  Return precautions given. Patient expresses understanding and agrees to plan.  Patient discharged in stable condition to [facility] urgent care for splinting.   Final Clinical Impressions(s) / UC Diagnoses   Final diagnoses:  Closed displaced fracture of neck of fifth metacarpal bone of left hand, initial encounter   ED Prescriptions    Medication Sig Dispense Auth. Provider   diclofenac (VOLTAREN) 50 MG EC tablet Take 1 tablet (50 mg total) by mouth 2 (two) times daily. 20 tablet Chelsei Mcchesney V, PA-C   traMADol (ULTRAM) 50 MG tablet Take 1 tablet (50 mg total) by mouth every 6 (six) hours as needed. 15 tablet Ok Edwards, PA-C     I have reviewed the PDMP during this encounter.   Ok Edwards, PA-C 04/25/20 0800

## 2020-06-15 ENCOUNTER — Encounter: Payer: Self-pay | Admitting: Obstetrics

## 2021-09-07 IMAGING — DX DG HAND COMPLETE 3+V*L*
3 series · 3 of 3 positions shown · non-contrast
Comparison: Radiograph 04/07/2017

CLINICAL DATA: Left hand pain after injury playing basketball
tonight.

EXAM:
LEFT HAND - COMPLETE 3+ VIEW

[hand pa]
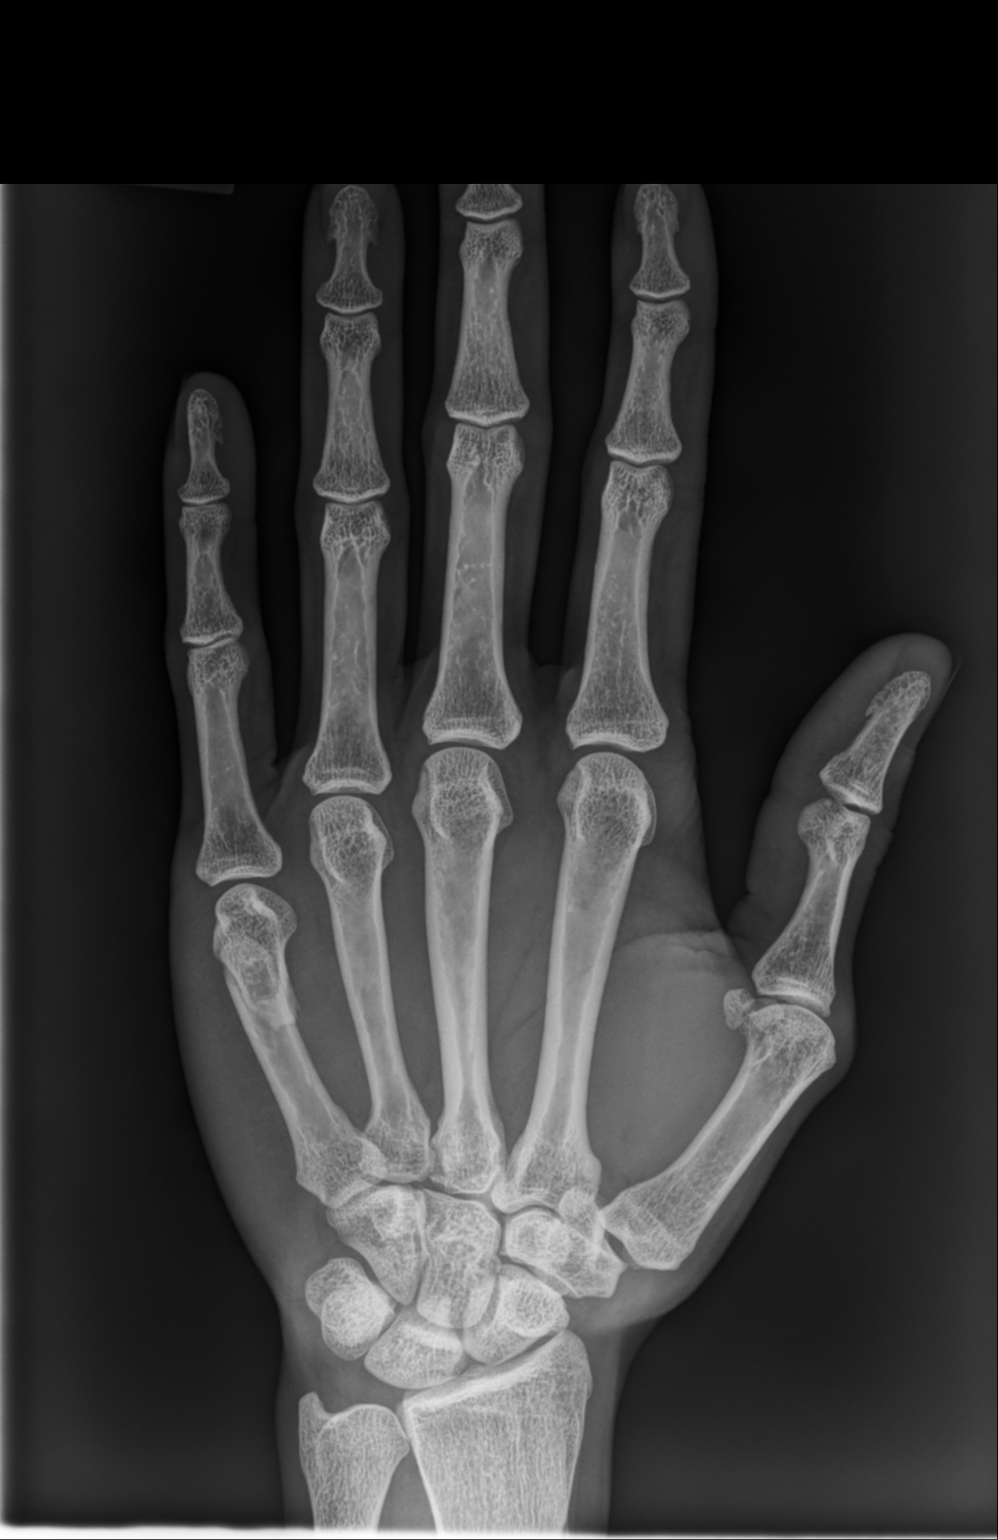

[hand mlo]
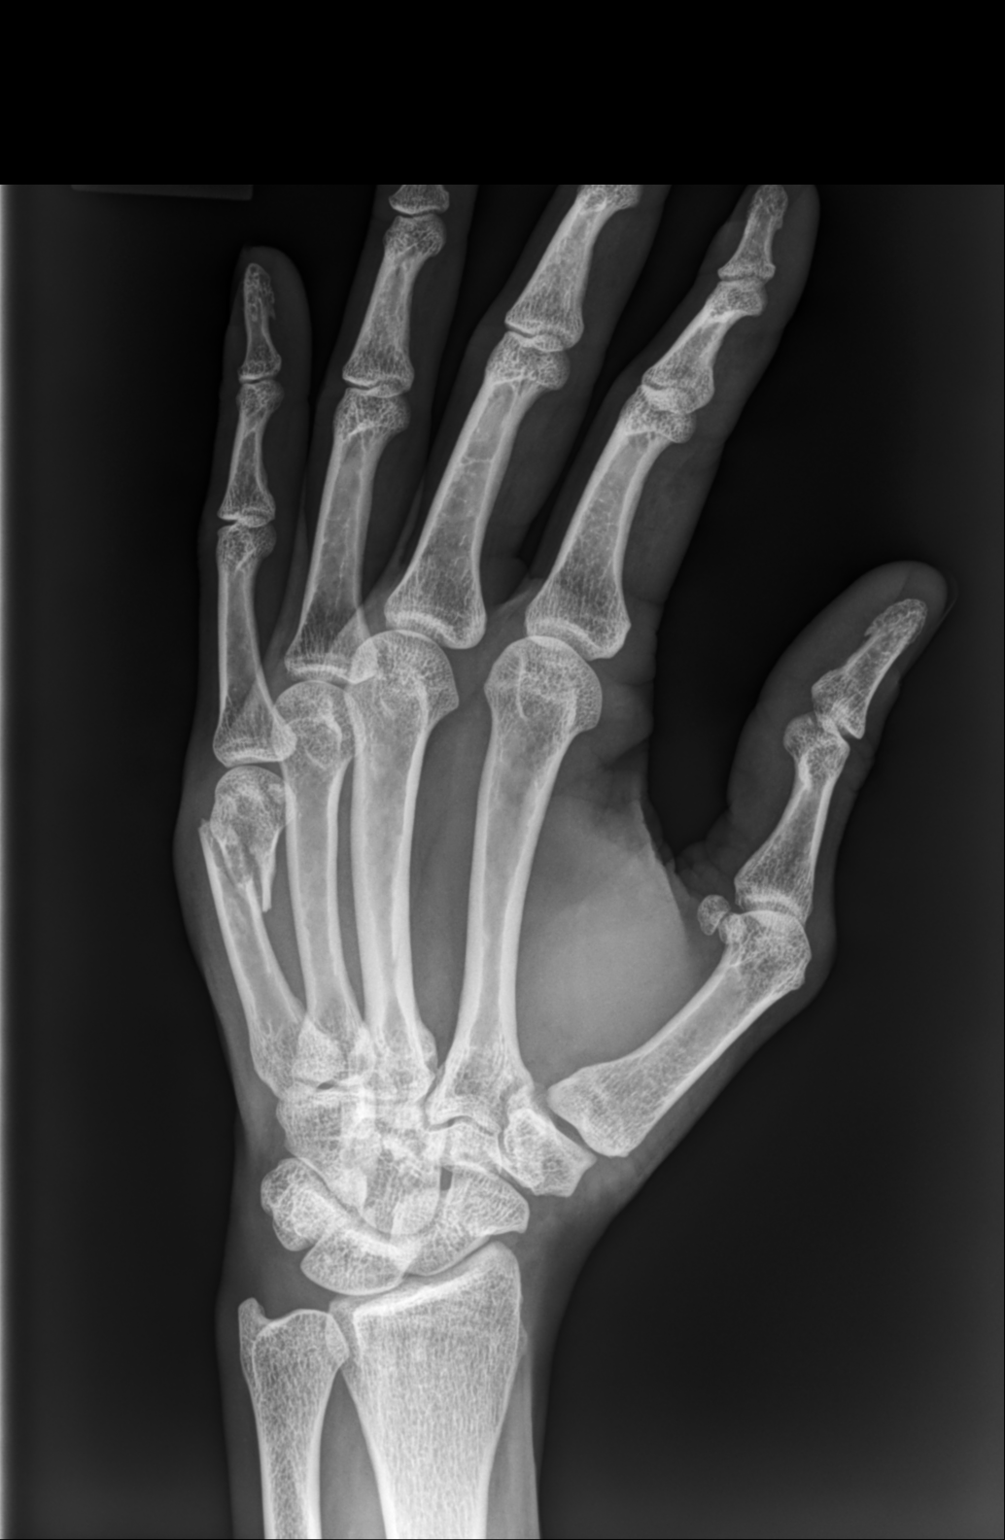

[hand lat]
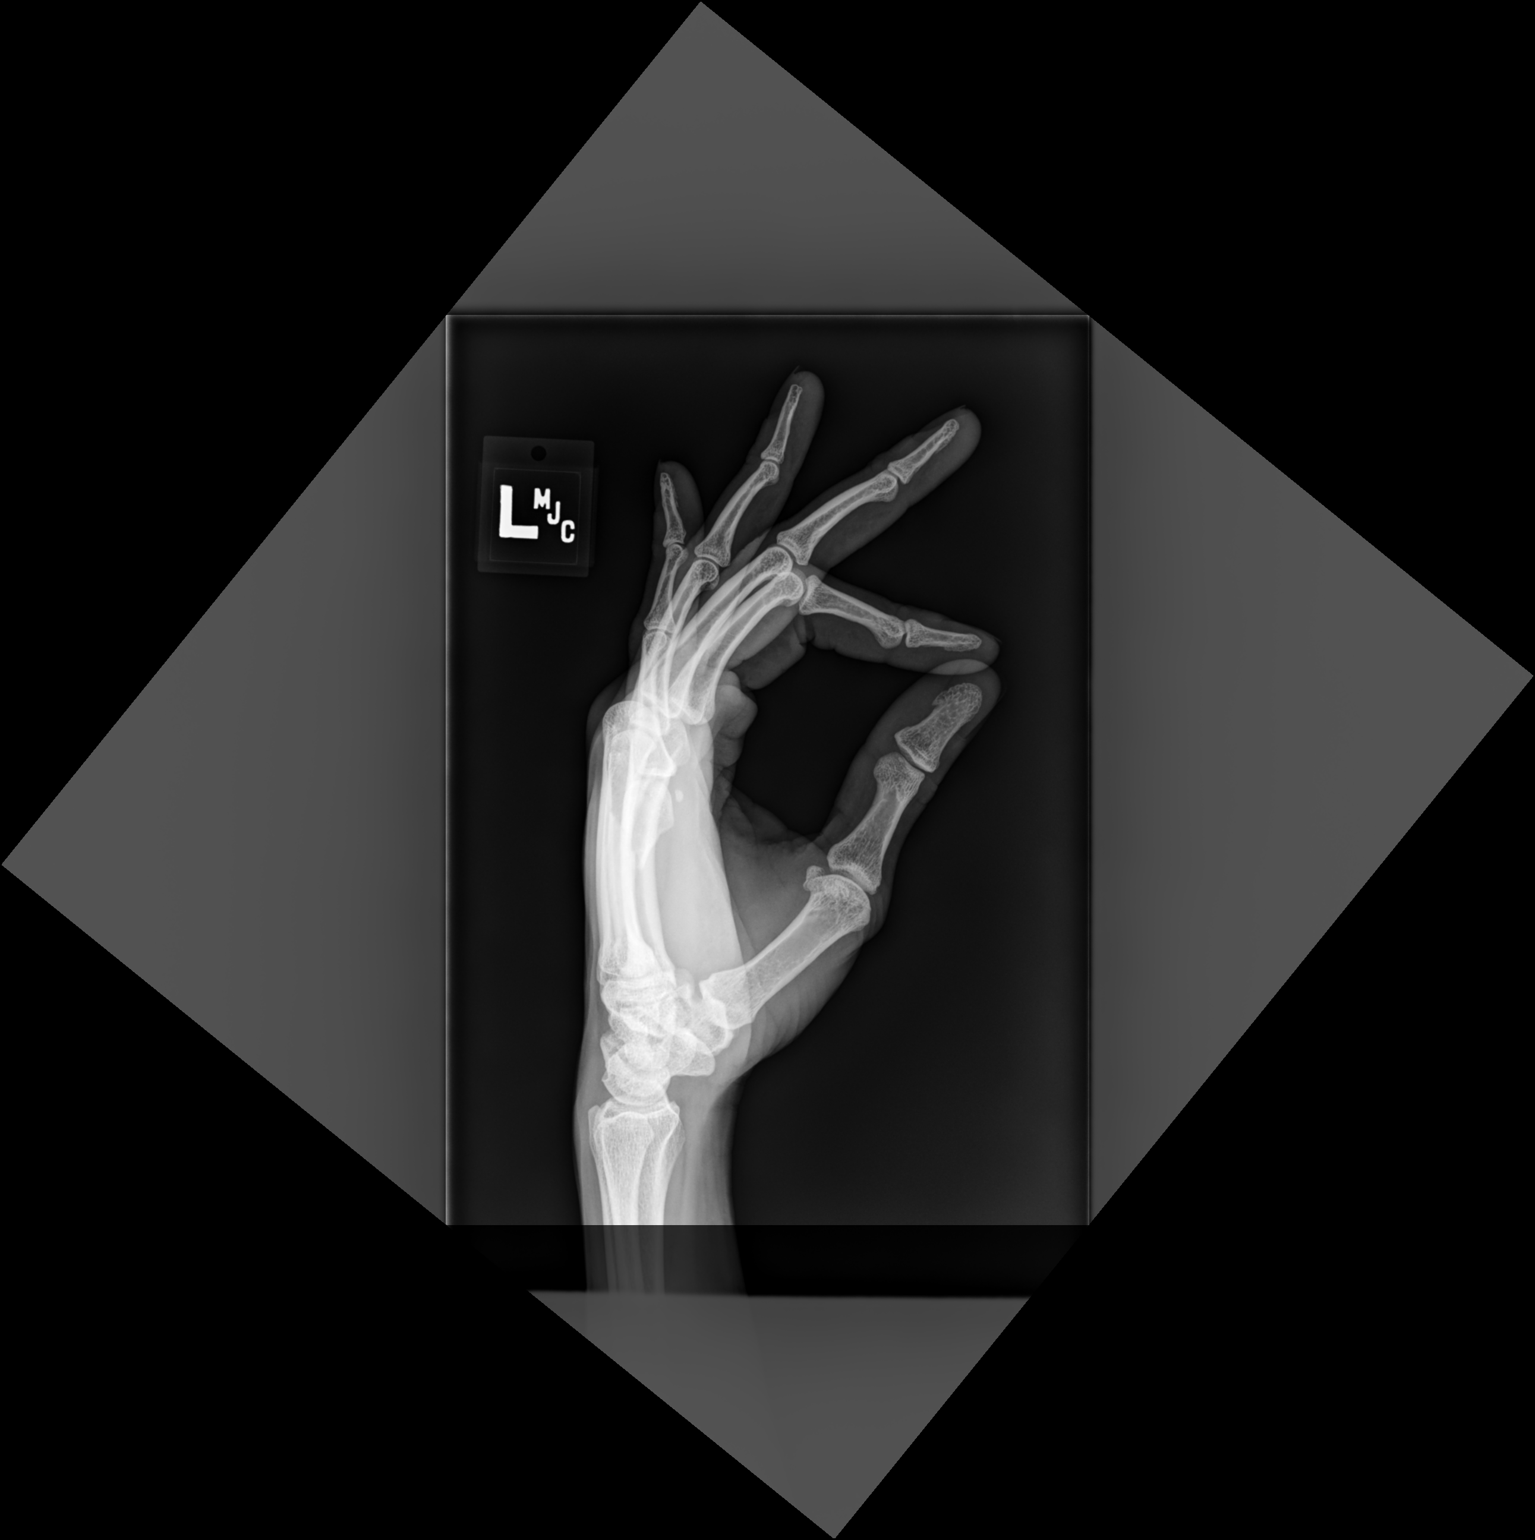

[3 of 3 positions shown; findings below may reference images not displayed]

FINDINGS: Oblique displaced fracture of the distal fifth metacarpal shaft. No
significant angulation. No intra-articular extension. No additional
fracture of the hand. Soft tissue edema is noted adjacent to the
fracture site.
IMPRESSION: Oblique displaced distal fifth metacarpal shaft fracture.

## 2023-06-04 DIAGNOSIS — Z720 Tobacco use: Secondary | ICD-10-CM | POA: Diagnosis not present

## 2023-06-04 DIAGNOSIS — R92343 Mammographic extreme density, bilateral breasts: Secondary | ICD-10-CM | POA: Diagnosis not present

## 2023-06-04 DIAGNOSIS — Z0000 Encounter for general adult medical examination without abnormal findings: Secondary | ICD-10-CM | POA: Diagnosis not present

## 2023-06-04 DIAGNOSIS — Z1330 Encounter for screening examination for mental health and behavioral disorders, unspecified: Secondary | ICD-10-CM | POA: Diagnosis not present

## 2023-06-04 DIAGNOSIS — Z1231 Encounter for screening mammogram for malignant neoplasm of breast: Secondary | ICD-10-CM | POA: Diagnosis not present

## 2023-06-04 DIAGNOSIS — M79642 Pain in left hand: Secondary | ICD-10-CM | POA: Diagnosis not present

## 2023-06-04 DIAGNOSIS — M25521 Pain in right elbow: Secondary | ICD-10-CM | POA: Diagnosis not present

## 2023-06-04 DIAGNOSIS — Z113 Encounter for screening for infections with a predominantly sexual mode of transmission: Secondary | ICD-10-CM | POA: Diagnosis not present

## 2023-12-06 DIAGNOSIS — J209 Acute bronchitis, unspecified: Secondary | ICD-10-CM | POA: Diagnosis not present
